# Patient Record
Sex: Male | Born: 1995 | Race: White | Hispanic: No | Marital: Single | Smoking: Never smoker
Health system: Southern US, Community
[De-identification: ages and names within clinical notes are randomized; demographics above are authoritative.]

---
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
## 2022-06-18 ENCOUNTER — Encounter (HOSPITAL_COMMUNITY): Payer: Self-pay | Admitting: *Deleted

## 2022-06-18 ENCOUNTER — Other Ambulatory Visit: Payer: Self-pay

## 2022-06-18 ENCOUNTER — Ambulatory Visit (HOSPITAL_COMMUNITY)
Admission: EM | Admit: 2022-06-18 | Discharge: 2022-06-18 | Disposition: A | Discharge status: Home / Self Care | Payer: BC Managed Care – PPO

## 2022-06-18 DIAGNOSIS — S61202A Unspecified open wound of right middle finger without damage to nail, initial encounter: Secondary | ICD-10-CM

## 2022-06-18 DIAGNOSIS — S61209A Unspecified open wound of unspecified finger without damage to nail, initial encounter: Secondary | ICD-10-CM

## 2022-06-18 DIAGNOSIS — S6991XA Unspecified injury of right wrist, hand and finger(s), initial encounter: Secondary | ICD-10-CM

## 2022-06-18 NOTE — ED Triage Notes (Signed)
PT reports cutting his finger RT middle finger on slicer.

## 2022-06-18 NOTE — ED Provider Notes (Signed)
[facility]    CSN: [id number] Arrival date & time: 06/18/22  1718      History   Chief Complaint Chief Complaint  Patient presents with   Laceration    HPI Blake Keller is a 26 y.o. male.  Presents with right finger injury Was using a vegetable slicer when he cut the tip off right middle finger Bleeding a lot at first, applied bandage at home Reports lots of pain to finger tip. No medications PTA  Tetanus updated 2 years ago   History reviewed. No pertinent past medical history.  There are no problems to display for this patient.  History reviewed. No pertinent surgical history.   Home Medications    Prior to Admission medications   Not on File    Family History History reviewed. No pertinent family history.  Social History Social History   Tobacco Use   Smoking status: Never   Smokeless tobacco: Never     Allergies   Patient has no known allergies.   Review of Systems Review of Systems Per HPI  Physical Exam Triage Vital Signs ED Triage Vitals  Enc Vitals Group     BP 06/18/22 1811 132/87     Pulse Rate 06/18/22 1811 68     Resp 06/18/22 1811 16     Temp 06/18/22 1811 98.4 F (36.9 C)     Temp src --      SpO2 06/18/22 1811 98 %     Weight --      Height --      Head Circumference --      Peak Flow --      Pain Score 06/18/22 1807 3     Pain Loc --      Pain Edu? --      Excl. in [place]? --    No data found.  Updated Vital Signs BP 132/87   Pulse 68   Temp 98.4 F (36.9 C)   Resp 16   SpO2 98%     Physical Exam Vitals and nursing note reviewed.  Constitutional:      General: He is not in acute distress.    Appearance: Normal appearance.     Comments: Sitting with arm elevated, bandage on finger  Cardiovascular:     Rate and Rhythm: Normal rate and regular rhythm.     Pulses: Normal pulses.  Pulmonary:     Effort: Pulmonary effort is normal.  Skin:    Findings: Wound present.     Comments: Right  middle finger tip with avulsion, bleeding is controlled. Very tender. Visualized muscle, no bone. No nail damage noted.   Neurological:     Mental Status: He is alert and oriented to person, place, and time.     UC Treatments / Results  Labs (all labs ordered are listed, but only abnormal results are displayed) Labs Reviewed - No data to display  EKG  Radiology No results found.  Procedures Procedures   Medications Ordered in UC Medications - No data to display  Initial Impression / Assessment and Plan / UC Course  I have reviewed the triage vital signs and the nursing notes.  Pertinent labs & imaging results that were available during my care of the patient were reviewed by me and considered in my medical decision making (see chart for details).  Avulsion injury Imaging not warranted given full visualization of wound, small skin avulsion without nail damage Applied bandage, recommend keep on overnight Discussed wound care.  Discussed use of ibu for pain. All questions answered. Return precautions discussed. Patient agrees to plan  Final Clinical Impressions(s) / UC Diagnoses   Final diagnoses:  Avulsion of finger tip, initial encounter  Injury of right ring finger, initial encounter     Discharge Instructions      You should leave the bandage on for a few hours to help the blood clot at the tip of your finger.  You may need to use a dressing like this for the next few days. I recommend antibiotic ointment twice daily.  You can take up to 800 mg of ibuprofen for pain. I recommend taking every 6 hours for the next few days.  The skin will take some time to heal and grow back, and you'll likely have a scar.  Please return to the urgent care if symptoms do not improve, or go to the emergency department if symptoms worsen.    ED Prescriptions   None    PDMP not reviewed this encounter.   Kyra Leyland 06/18/22 1911

## 2022-06-18 NOTE — Discharge Instructions (Addendum)
You should leave the bandage on for a few hours to help the blood clot at the tip of your finger.  You may need to use a dressing like this for the next few days. I recommend antibiotic ointment twice daily.  You can take up to 800 mg of ibuprofen for pain. I recommend taking every 6 hours for the next few days.  The skin will take some time to heal and grow back, and you'll likely have a scar.  Please return to the urgent care if symptoms do not improve, or go to the emergency department if symptoms worsen.
